# Patient Record
Sex: Male | Born: 1980 | Race: Black or African American | Hispanic: No | Smoking: Never smoker
Health system: Southern US, Community
[De-identification: ages and names within clinical notes are randomized; demographics above are authoritative.]

## PROBLEM LIST (undated history)

## (undated) DIAGNOSIS — E669 Obesity, unspecified: Secondary | ICD-10-CM

---

## 2018-05-08 ENCOUNTER — Emergency Department (HOSPITAL_COMMUNITY): Payer: Self-pay

## 2018-05-08 ENCOUNTER — Other Ambulatory Visit: Payer: Self-pay

## 2018-05-08 ENCOUNTER — Emergency Department (HOSPITAL_COMMUNITY)
Admission: EM | Admit: 2018-05-08 | Discharge: 2018-05-08 | Disposition: A | Discharge status: Home / Self Care | Payer: Self-pay | Attending: Physician Assistant | Admitting: Physician Assistant

## 2018-05-08 DIAGNOSIS — M25572 Pain in left ankle and joints of left foot: Secondary | ICD-10-CM | POA: Insufficient documentation

## 2018-05-08 DIAGNOSIS — R030 Elevated blood-pressure reading, without diagnosis of hypertension: Secondary | ICD-10-CM | POA: Insufficient documentation

## 2018-05-08 MED ORDER — MELOXICAM 15 MG PO TABS: 15.0000 mg | ORAL_TABLET | Freq: Every day | ORAL | 0 refills | Status: AC

## 2018-05-08 NOTE — ED Provider Notes (Signed)
Patient placed in Quick Look pathway, seen and evaluated   Chief Complaint: left ankle pain  HPI:   Jeff Walton is a 37 y.o. male who presents to the ED with left ankle pain that started a few days ago. Patient reports he has been walking a lot and thinks he may have injured it.   ROS: M/S: left ankle pain  Physical Exam:  BP (!) 160/100 (BP Location: Right Arm)   Pulse 82   Temp 98.2 F (36.8 C) (Oral)   Resp 18   SpO2 100%    Gen: No distress, morbidly obest  Neuro: Awake and Alert  Skin: Warm and dry  M/S: tender with palpation and range of motion left medial ankle.      Initiation of care has begun. The patient has been counseled on the process, plan, and necessity for staying for the completion/evaluation, and the remainder of the medical screening examination    Ashley Murrain, NP 05/08/18 1408    Julianne Rice, MD 05/09/18 [phone]

## 2018-05-08 NOTE — Discharge Instructions (Addendum)
Appear to have a tendinitis of the medial ankle from pronation.  You need to ice the ankle several times a day.  Take anti-inflammatories that I prescribed but do not take any over-the-counter Advil, Aleve, aspirin.  He may take Tylenol.  Please follow-up closely with Dr. Amalia Hailey or another podiatrist in his practice.  Return for any new or worsening symptoms including fevers or chills.

## 2018-05-08 NOTE — ED Triage Notes (Signed)
Pt. Works for [employer] and last week developed, lt. Ankle pain.  Pt. Denies any injuries,  Lt. Inner ankle is tender to touch and swollen.   Pt. Woke up this am and could not put weight on the ankle and fell getting out of bed.  Pt. Has been taking ibuprofen for pain.  +CNS noted

## 2018-05-08 NOTE — ED Provider Notes (Signed)
[name] EMERGENCY DEPARTMENT Provider Note   CSN: [id number] Arrival date & time: 05/08/18  1257     History   Chief Complaint Chief Complaint  Patient presents with  . Ankle Pain    HPI Jeff Walton is a 37 y.o. male with history of morbid obesity who presents the emergency department with chief complaint of pain in the medial left ankle.  Patient has a history of flatfeet.  Patient states that he started noticing some pain on the medial side of his foot which was worse with walking and better at rest.  He states that over the past 24 hours it has gotten far more painful.  Today he says he was barely able to walk on his foot.  He denies any injuries.  He denies fever or chills.  HPI  No past medical history on file.  There are no active problems to display for this patient.    The histories are not reviewed yet. Please review them in the "History" navigator section and refresh this [place].      Home Medications    Prior to Admission medications   Not on File    Family History No family history on file.  Social History Social History   Tobacco Use  . Smoking status: Not on file  Substance Use Topics  . Alcohol use: Not on file  . Drug use: Not on file     Allergies   Shellfish allergy   Review of Systems Review of Systems  Positive for gait abnormality, ankle swelling, negative for weakness or numbness Physical Exam Updated Vital Signs BP (!) 160/100 (BP Location: Right Arm)   Pulse 82   Temp 98.2 F (36.8 C) (Oral)   Resp 18   Ht 5\' 9"  (1.753 m)   Wt (!) 204.1 kg (450 lb)   SpO2 100%   BMI 66.45 kg/m   Physical Exam Physical Exam  Nursing note and vitals reviewed. Constitutional: He appears well-developed and well-nourished. No distress.  HENT:  Head: Normocephalic and atraumatic.  Eyes: Conjunctivae normal are normal. No scleral icterus.  Neck: Normal range of motion. Neck supple.  Cardiovascular: Normal rate,  regular rhythm and normal heart sounds.   Pulmonary/Chest: Effort normal and breath sounds normal. No respiratory distress.  Abdominal: Soft. There is no tenderness.  Musculoskeletal: Warmth heat and redness along the medial side of the left foot just proximal to the ankle in the distribution of the Peroneus tendon. Neurological: He is alert.  Skin: Skin is warm and dry. He is not diaphoretic.  Psychiatric: His behavior is normal.     ED Treatments / Results  Labs (all labs ordered are listed, but only abnormal results are displayed) Labs Reviewed - No data to display  EKG None  Radiology Dg Ankle Complete Left  Result Date: 05/08/2018 CLINICAL DATA:  Worsening left ankle pain when flexing the left foot. No injury. EXAM: LEFT ANKLE COMPLETE - 3+ VIEW COMPARISON:  None. FINDINGS: No acute fracture or dislocation is identified. There is mild lateral tibiotalar scratched of there is mild degenerative tibiotalar spurring. Mild dorsal spurring is noted in the midfoot. An os trigonum is noted. No focal soft tissue abnormality is seen. IMPRESSION: Mild degenerative changes without acute osseous abnormality. Electronically Signed   By: Logan Bores M.D.   On: 05/08/2018 14:56    Procedures Procedures (including critical care time)  Medications Ordered in ED Medications - No data to display   Initial Impression / Assessment  and Plan / ED Course  I have reviewed the triage vital signs and the nursing notes.  Pertinent labs & imaging results that were available during my care of the patient were reviewed by me and considered in my medical decision making (see chart for details).     Patient with medial ankle pain.  Suspect Perrone is tendinitis from ankle pronation flatfootedness, and morbid obesity.  Patient advised to ice and use anti-inflammatories.  I given an follow-up visit with Dr. Amalia Hailey in podiatry.  Patient appears appropriate for discharge at this time  Final Clinical  Impressions(s) / ED Diagnoses   Final diagnoses:  Acute left ankle pain  Elevated blood pressure reading    ED Discharge Orders    None       Margarita Mail, PA-C 05/08/18 1551    Macarthur Critchley, MD 05/09/18 [phone]

## 2018-05-12 ENCOUNTER — Ambulatory Visit: Payer: Self-pay | Admitting: Podiatry

## 2018-06-16 ENCOUNTER — Other Ambulatory Visit: Payer: Self-pay

## 2018-06-16 ENCOUNTER — Emergency Department (HOSPITAL_COMMUNITY): Payer: No Typology Code available for payment source

## 2018-06-16 ENCOUNTER — Emergency Department (HOSPITAL_COMMUNITY)
Admission: EM | Admit: 2018-06-16 | Discharge: 2018-06-16 | Disposition: A | Discharge status: Home / Self Care | Payer: No Typology Code available for payment source | Attending: Emergency Medicine | Admitting: Emergency Medicine

## 2018-06-16 DIAGNOSIS — M545 Low back pain, unspecified: Secondary | ICD-10-CM

## 2018-06-16 DIAGNOSIS — Y9389 Activity, other specified: Secondary | ICD-10-CM | POA: Diagnosis not present

## 2018-06-16 DIAGNOSIS — Y92410 Unspecified street and highway as the place of occurrence of the external cause: Secondary | ICD-10-CM | POA: Insufficient documentation

## 2018-06-16 DIAGNOSIS — M5489 Other dorsalgia: Secondary | ICD-10-CM | POA: Diagnosis present

## 2018-06-16 DIAGNOSIS — Y999 Unspecified external cause status: Secondary | ICD-10-CM | POA: Insufficient documentation

## 2018-06-16 DIAGNOSIS — G8911 Acute pain due to trauma: Secondary | ICD-10-CM | POA: Insufficient documentation

## 2018-06-16 MED ORDER — LIDOCAINE 5 % EX PTCH: 1.0000 | MEDICATED_PATCH | CUTANEOUS | 0 refills | Status: AC

## 2018-06-16 MED ORDER — CYCLOBENZAPRINE HCL 10 MG PO TABS: 10.0000 mg | ORAL_TABLET | Freq: Two times a day (BID) | ORAL | 0 refills | Status: DC | PRN
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
## 2018-08-07 ENCOUNTER — Emergency Department (HOSPITAL_COMMUNITY)
Admission: EM | Admit: 2018-08-07 | Discharge: 2018-08-08 | Disposition: A | Discharge status: Home / Self Care | Payer: No Typology Code available for payment source | Attending: Emergency Medicine | Admitting: Emergency Medicine

## 2018-08-07 ENCOUNTER — Other Ambulatory Visit: Payer: Self-pay

## 2018-08-07 ENCOUNTER — Encounter (HOSPITAL_COMMUNITY): Payer: Self-pay | Admitting: Emergency Medicine

## 2018-08-07 DIAGNOSIS — M545 Low back pain: Secondary | ICD-10-CM | POA: Insufficient documentation

## 2018-08-07 DIAGNOSIS — Y939 Activity, unspecified: Secondary | ICD-10-CM | POA: Insufficient documentation

## 2018-08-07 DIAGNOSIS — Y999 Unspecified external cause status: Secondary | ICD-10-CM | POA: Insufficient documentation

## 2018-08-07 DIAGNOSIS — Y92410 Unspecified street and highway as the place of occurrence of the external cause: Secondary | ICD-10-CM | POA: Diagnosis not present

## 2018-08-07 DIAGNOSIS — S39012A Strain of muscle, fascia and tendon of lower back, initial encounter: Secondary | ICD-10-CM

## 2018-08-07 DIAGNOSIS — S3992XA Unspecified injury of lower back, initial encounter: Secondary | ICD-10-CM | POA: Diagnosis present

## 2018-08-07 HISTORY — DX: Obesity, unspecified: E66.9
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
## 2018-08-07 NOTE — ED Notes (Signed)
Called for Pt to recheck vitals. No answer 

## 2018-08-08 ENCOUNTER — Emergency Department (HOSPITAL_COMMUNITY): Payer: No Typology Code available for payment source

## 2018-08-08 MED ORDER — PREDNISONE 50 MG PO TABS: 50.0000 mg | ORAL_TABLET | Freq: Every day | ORAL | 0 refills | Status: AC

## 2018-08-08 MED ORDER — CYCLOBENZAPRINE HCL 10 MG PO TABS: 10.0000 mg | ORAL_TABLET | Freq: Every day | ORAL | 0 refills | Status: AC

## 2018-08-08 MED ORDER — TRAMADOL HCL 50 MG PO TABS: 50.0000 mg | ORAL_TABLET | Freq: Four times a day (QID) | ORAL | 0 refills | Status: DC | PRN

## 2018-08-08 NOTE — Discharge Instructions (Signed)
Your x-rays did not show any abnormalities at this time.  Follow-up with the orthopedic office provided.  Return here for any worsening in your condition.  Use ice and heat on your lower back.
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
## 2020-01-14 ENCOUNTER — Emergency Department (HOSPITAL_COMMUNITY): Payer: No Typology Code available for payment source

## 2020-01-14 ENCOUNTER — Emergency Department (HOSPITAL_COMMUNITY)
Admission: EM | Admit: 2020-01-14 | Discharge: 2020-01-14 | Disposition: A | Discharge status: Home / Self Care | Payer: No Typology Code available for payment source | Attending: Emergency Medicine | Admitting: Emergency Medicine

## 2020-01-14 ENCOUNTER — Other Ambulatory Visit: Payer: Self-pay

## 2020-01-14 DIAGNOSIS — Y92410 Unspecified street and highway as the place of occurrence of the external cause: Secondary | ICD-10-CM | POA: Diagnosis not present

## 2020-01-14 DIAGNOSIS — Y939 Activity, unspecified: Secondary | ICD-10-CM | POA: Insufficient documentation

## 2020-01-14 DIAGNOSIS — Y999 Unspecified external cause status: Secondary | ICD-10-CM | POA: Diagnosis not present

## 2020-01-14 DIAGNOSIS — S199XXA Unspecified injury of neck, initial encounter: Secondary | ICD-10-CM | POA: Diagnosis present

## 2020-01-14 DIAGNOSIS — S161XXA Strain of muscle, fascia and tendon at neck level, initial encounter: Secondary | ICD-10-CM | POA: Diagnosis not present

## 2020-01-14 MED ORDER — IBUPROFEN 800 MG PO TABS: 800.0000 mg | ORAL_TABLET | Freq: Three times a day (TID) | ORAL | 0 refills | Status: AC | PRN

## 2020-01-14 MED ORDER — TRAMADOL HCL 50 MG PO TABS: 50.0000 mg | ORAL_TABLET | Freq: Four times a day (QID) | ORAL | 0 refills | Status: AC | PRN
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
## 2020-01-14 NOTE — Discharge Instructions (Addendum)
Return here as needed.  Follow-up with a primary doctor.  Use ice and heat on the areas that are sore.  You expect to be more sore later and over the next 7 to 10 days.
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
## 2020-01-14 NOTE — ED Notes (Signed)
Patient transported to CT 

## 2020-01-14 NOTE — ED Notes (Signed)
Discharge paperwork reviewed with pt.  Pt verbalized understanding, has no questions or concerns at this time.  Ambulatory at discharge.

## 2020-04-04 ENCOUNTER — Other Ambulatory Visit: Payer: Self-pay

## 2020-04-04 ENCOUNTER — Encounter (HOSPITAL_COMMUNITY): Payer: Self-pay

## 2020-04-04 ENCOUNTER — Ambulatory Visit (HOSPITAL_COMMUNITY)
Admission: EM | Admit: 2020-04-04 | Discharge: 2020-04-04 | Disposition: A | Discharge status: Home / Self Care | Payer: Commercial Managed Care - PPO

## 2020-04-04 DIAGNOSIS — D234 Other benign neoplasm of skin of scalp and neck: Secondary | ICD-10-CM

## 2020-04-04 NOTE — ED Provider Notes (Signed)
[facility]    CSN: [id number] Arrival date & time: 04/04/20  1807      History   Chief Complaint Chief Complaint  Patient presents with  . Mass    HPI Jeff Walton is a 39 y.o. male.   Patient reports for evaluation of a mass that has been present on the left side of his scalp for about 6 months.  Reports he noticed that after getting his haircut shoulder about 6 months ago.  He denies that it is grown since that time.  Denies pain, denies drainage.  He reports he is mainly considering removal for cosmetic reasons.     Past Medical History:  Diagnosis Date  . Obesity     There are no problems to display for this patient.   History reviewed. No pertinent surgical history.     Home Medications    Prior to Admission medications   Medication Sig Start Date End Date Taking? Authorizing Provider  cyclobenzaprine (FLEXERIL) 10 MG tablet Take 1 tablet (10 mg total) by mouth at bedtime. Patient not taking: Reported on 01/14/2020 08/08/18   Dalia Heading, PA-C  ibuprofen (ADVIL) 800 MG tablet Take 1 tablet (800 mg total) by mouth every 8 (eight) hours as needed. 01/14/20   Lawyer, Harrell Gave, PA-C  lidocaine (LIDODERM) 5 % Place 1 patch onto the skin daily. Remove & Discard patch within 12 hours or as directed by MD Patient not taking: Reported on 01/14/2020 06/16/18   Doristine Devoid, PA-C  meloxicam (MOBIC) 15 MG tablet Take 1 tablet (15 mg total) by mouth daily. Take 1 daily with food. Patient not taking: Reported on 01/14/2020 05/08/18   Margarita Mail, PA-C  predniSONE (DELTASONE) 50 MG tablet Take 1 tablet (50 mg total) by mouth daily with breakfast. Patient not taking: Reported on 01/14/2020 08/08/18   Dalia Heading, PA-C  traMADol (ULTRAM) 50 MG tablet Take 1 tablet (50 mg total) by mouth every 6 (six) hours as needed for severe pain. 01/14/20   Dalia Heading, PA-C    Family History Family History  Problem Relation Age of Onset  . Healthy  Mother   . Healthy Father     Social History Social History   Tobacco Use  . Smoking status: Never Smoker  . Smokeless tobacco: Never Used  Substance Use Topics  . Alcohol use: Never  . Drug use: Never     Allergies   Shellfish allergy   Review of Systems Review of Systems   Physical Exam Triage Vital Signs ED Triage Vitals  Enc Vitals Group     BP 04/04/20 1903 140/83     Pulse Rate 04/04/20 1903 88     Resp 04/04/20 1903 20     Temp 04/04/20 1903 99 F (37.2 C)     Temp Source 04/04/20 1903 Oral     SpO2 04/04/20 1903 98 %     Weight --      Height --      Head Circumference --      Peak Flow --      Pain Score 04/04/20 1902 0     Pain Loc --      Pain Edu? --      Excl. in [place]? --    No data found.  Updated Vital Signs BP 140/83 (BP Location: Right Wrist)   Pulse 88   Temp 99 F (37.2 C) (Oral)   Resp 20   SpO2 98%   Visual Acuity Right Eye Distance:  Left Eye Distance:   Bilateral Distance:    Right Eye Near:   Left Eye Near:    Bilateral Near:     Physical Exam Vitals and nursing note reviewed.  Constitutional:      Appearance: Normal appearance.  HENT:     Head:      Comments: There is a approximately 3.5 cm in diameter mobile cyst on the left sided scalp.  Roughly at the juncture of the occipital and parietal region.  This is marked accordingly in the chart.  No tenderness or erythema. Neurological:     Mental Status: He is alert.      UC Treatments / Results  Labs (all labs ordered are listed, but only abnormal results are displayed) Labs Reviewed - No data to display  EKG   Radiology No results found.  Procedures Procedures (including critical care time)  Medications Ordered in UC Medications - No data to display  Initial Impression / Assessment and Plan / UC Course  I have reviewed the triage vital signs and the nursing notes.  Pertinent labs & imaging results that were available during my care of the patient  were reviewed by me and considered in my medical decision making (see chart for details).     #Cyst of scalp Patient 39 year old with suspected cyst on left side of scalp.  Discussed with patient given the size location and his desire for best cosmetic and long-term outcome that would be best for him to have this handled with a surgery group.  [facility] surgery service information was given.  Discussed with patient of prior to being seen by surgical consultation, the cyst becomes very painful, red or starts draining anything to return for reevaluation for temporizing treatment.  He verbalized agreement understanding the plan. Final Clinical Impressions(s) / UC Diagnoses   Final diagnoses:  Dermoid cyst of scalp     Discharge Instructions     You have  cyst on your scalp and this will be best handled by a surgeon. Call central [facility] surgical service for evaluation of removal  If it becomes red, painful and drains, return to urgent care for evaluation    ED Prescriptions    None     PDMP not reviewed this encounter.   Purnell Shoemaker, PA-C 04/05/20 [phone]

## 2020-04-04 NOTE — ED Triage Notes (Signed)
Pt c/o "bump" to left side of head behind left ear for approx 6 months. Pt denies pain, drainage of area.  Area of approx 4cm diameter palpated with soft fluctuance noted.  Denies fever, n/v/d,.

## 2020-04-04 NOTE — Discharge Instructions (Signed)
You have  cyst on your scalp and this will be best handled by a surgeon. Call central [facility] surgical service for evaluation of removal  If it becomes red, painful and drains, return to urgent care for evaluation

## 2020-04-16 ENCOUNTER — Encounter (HOSPITAL_COMMUNITY): Payer: Self-pay | Admitting: Surgery

## 2020-04-16 ENCOUNTER — Ambulatory Visit (HOSPITAL_COMMUNITY): Payer: Self-pay | Admitting: Surgery

## 2020-04-16 DIAGNOSIS — R220 Localized swelling, mass and lump, head: Secondary | ICD-10-CM | POA: Insufficient documentation

## 2020-04-16 DIAGNOSIS — Z9989 Dependence on other enabling machines and devices: Secondary | ICD-10-CM | POA: Insufficient documentation

## 2020-04-16 NOTE — H&P (Signed)
Jeff Walton Appointment: 04/16/2020 1:45 PM Location: [facility] Patient #: [id number] DOB: 06-11-1981 Single / Language: Jeff Walton / Race: Black or African American Male  History of Present Illness Adin Hector MD; 04/16/2020 2:54 PM) The patient is a 39 year old male who presents with a soft tissue mass. Note for "Soft tissue mass": ` ` ` Patient sent for surgical consultation at the request of Roland Rack, PA, [place]  Chief Complaint: Scalp mass ` ` The patient is a pleasant morbidly obese male otherwise healthy and active. He noticed a lump in the back of his head. Has been there for at least 6 months. Feels like it's Slightly Larger. No Evidence of Infection or Inflammation. Discuss with Urgent Care Center. Concern for Cyst or Abscess. Surgical Consultation Offered Given Its Size and Location. Patient Denies Any History of Any Infections or Abscesses. 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 Recalls Bumping His Head but Not at That Particular Spot. No Headaches. No Lumps or Bumps Elsewhere. He Does Not Smoke. He Is Not Diabetic. He Does Have Sleep Apnea. He Uses CPAP at Night. Issues. Can Walk At Least a Half Hour without Difficulty  (Review of systems as stated in this history (HPI) or in the review of systems. Otherwise all other 12 point ROS are negative) ` ` `  This patient encounter took 25 minutes today to perform the following: obtain history, perform exam, review outside records, interpret tests & imaging, counsel the patient on their diagnosis; and, document this encounter, including findings & plan in the electronic health record (EHR).   Past Surgical History Sharyn Lull R. Brooks, CMA; 04/16/2020 1:39 PM) No pertinent past surgical history  Diagnostic Studies History Sharyn Lull R. Rolena Infante, CMA; 04/16/2020 1:39 PM) Colonoscopy never  Allergies Sharyn Lull R. Brooks, CMA; 04/16/2020 1:40 PM) Shellfish  Medication History (Michelle  R. Brooks, CMA; 04/16/2020 1:40 PM) No Current Medications Medications Reconciled  Social History Sharyn Lull R. Brooks, CMA; 04/16/2020 1:39 PM) No alcohol use No caffeine use No drug use Tobacco use Never smoker.  Family History Sharyn Lull R. Rolena Infante, CMA; 04/16/2020 1:39 PM) First Degree Relatives No pertinent family history  Other Problems Sharyn Lull R. Brooks, CMA; 04/16/2020 1:39 PM) Sleep Apnea     Review of Systems ([place]. Brooks CMA; 04/16/2020 1:39 PM) General Not Present- Appetite Loss, Chills, Fatigue, Fever, Night Sweats, Weight Gain and Weight Loss. Skin Not Present- Change in Wart/Mole, Dryness, Hives, Jaundice, New Lesions, Non-Healing Wounds, Rash and Ulcer. HEENT Not Present- Earache, Hearing Loss, Hoarseness, Nose Bleed, Oral Ulcers, Ringing in the Ears, Seasonal Allergies, Sinus Pain, Sore Throat, Visual Disturbances, Wears glasses/contact lenses and Yellow Eyes. Breast Not Present- Breast Mass, Breast Pain, Nipple Discharge and Skin Changes. Cardiovascular Not Present- Chest Pain, Difficulty Breathing Lying Down, Leg Cramps, Palpitations, Rapid Heart Rate, Shortness of Breath and Swelling of Extremities. Gastrointestinal Not Present- Abdominal Pain, Bloating, Bloody Stool, Change in Bowel Habits, Chronic diarrhea, Constipation, Difficulty Swallowing, Excessive gas, Gets full quickly at meals, Hemorrhoids, Indigestion, Nausea, Rectal Pain and Vomiting. Male Genitourinary Not Present- Blood in Urine, Change in Urinary Stream, Frequency, Impotence, Nocturia, Painful Urination, Urgency and Urine Leakage.  Vitals [employer] R. Brooks CMA; 04/16/2020 1:38 PM) 04/16/2020 1:37 PM Weight: 501.25 lb Height: 69in Body Surface Area: 3.05 m Body Mass Index: 74.02 kg/m  Pulse: 75 (Regular)  BP: 132/82(Sitting, Left Arm, Standard)        Physical Exam Adin Hector MD; 04/16/2020 2:10 PM)  General Mental Status-Alert. General  Appearance-Not in acute  distress, Not Sickly. Orientation-Oriented X3. Hydration-Well hydrated. Voice-Normal.  Integumentary Global Assessment Upon inspection and palpation of skin surfaces of the - Axillae: non-tender, no inflammation or ulceration, no drainage. and Distribution of scalp and body hair is normal. General Characteristics Temperature - normal warmth is noted.  Head and Neck Head-normocephalic, atraumatic with no lesions or palpable masses. Face Global Assessment - atraumatic, no absence of expression. Neck Global Assessment - no abnormal movements, no bruit auscultated on the right, no bruit auscultated on the left, no decreased range of motion, non-tender. Trachea-midline. Thyroid Gland Characteristics - non-tender. Note: On posterior scalp slightly left of benign is a 5x4 cm ellipsoid subcutaneous scalp mass. Somewhat mobile. No evident opening or sightings or inflammation.  Some lower scalp/upper neck nuchal fold but seems anomical. No other masses.  Eye Eyeball - Left-Extraocular movements intact, No Nystagmus - Left. Eyeball - Right-Extraocular movements intact, No Nystagmus - Right. Cornea - Left-No Hazy - Left. Cornea - Right-No Hazy - Right. Sclera/Conjunctiva - Left-No scleral icterus, No Discharge - Left. Sclera/Conjunctiva - Right-No scleral icterus, No Discharge - Right. Pupil - Left-Direct reaction to light normal. Pupil - Right-Direct reaction to light normal.  ENMT Ears Pinna - Left - no drainage observed, no generalized tenderness observed. Pinna - Right - no drainage observed, no generalized tenderness observed. Nose and Sinuses External Inspection of the Nose - no destructive lesion observed. Inspection of the nares - Left - quiet respiration. Inspection of the nares - Right - quiet respiration. Mouth and Throat Lips - Upper Lip - no fissures observed, no pallor noted. Lower Lip - no fissures observed, no pallor noted. Nasopharynx - no  discharge present. Oral Cavity/Oropharynx - Tongue - no dryness observed. Oral Mucosa - no cyanosis observed. Hypopharynx - no evidence of airway distress observed.  Chest and Lung Exam Inspection Movements - Normal and Symmetrical. Accessory muscles - No use of accessory muscles in breathing. Palpation Palpation of the chest reveals - Non-tender. Auscultation Breath sounds - Normal and Clear.  Cardiovascular Auscultation Rhythm - Regular. Murmurs & Other Heart Sounds - Auscultation of the heart reveals - No Murmurs and No Systolic Clicks.  Abdomen Inspection Inspection of the abdomen reveals - No Visible peristalsis and No Abnormal pulsations. Umbilicus - No Bleeding, No Urine drainage. Palpation/Percussion Palpation and Percussion of the abdomen reveal - Soft, Non Tender, No Rebound tenderness, No Rigidity (guarding) and No Cutaneous hyperesthesia. Note: Abdomen morbidly obese. Soft. No diastases. Not distended. No umbilical or incisional hernias. No guarding.  Male Genitourinary Sexual Maturity Tanner 5 - Adult hair pattern and Adult penile size and shape. Note: Hygiene good under panniculus. No inguinal lymphadenopathy or obvious hernias. Normal external male genitalia.  Peripheral Vascular Upper Extremity Inspection - Left - No Cyanotic nailbeds - Left, Not Ischemic. Inspection - Right - No Cyanotic nailbeds - Right, Not Ischemic.  Neurologic Neurologic evaluation reveals -normal attention span and ability to concentrate, able to name objects and repeat phrases. Appropriate fund of knowledge , normal sensation and normal coordination. Mental Status Affect - not angry, not paranoid. Cranial Nerves-Normal Bilaterally. Gait-Normal.  Neuropsychiatric Mental status exam performed with findings of-able to articulate well with normal speech/language, rate, volume and coherence, thought content normal with ability to perform basic computations and apply abstract  reasoning and no evidence of hallucinations, delusions, obsessions or homicidal/suicidal ideation.  Musculoskeletal Global Assessment Spine, Ribs and Pelvis - no instability, subluxation or laxity. Right Upper Extremity - no instability, subluxation or laxity.  Lymphatic Head & Neck  General Head & Neck Lymphatics: Bilateral - Description - No Localized lymphadenopathy. Axillary  General Axillary Region: Bilateral - Description - No Localized lymphadenopathy. Femoral & Inguinal  Generalized Femoral & Inguinal Lymphatics: Left - Description - No Localized lymphadenopathy. Right - Description - No Localized lymphadenopathy.    Assessment & Plan Adin Hector MD; 04/16/2020 2:55 PM)  SCALP MASS (R22.0) Impression: 5x4cm posterior scalp mass on the left side.  Ellipsoid and somewhat mobile. Still soft. Most likely benign epidermal cyst versus lipoma.  Because it has gotten slightly larger and occasionally irritating, it is reasonable to remove this. Given its size and scalp location, this is too much to try excise under local anesthetic. I would recommend outpatient surgery with monitored deep sedation if not Local Anesthetic. Should Be an Outpatient Surgery.  He Is Interested in Proceeding.  Current Plans You are being scheduled for surgery- Our schedulers will call you.  You should hear from our office's scheduling department within 5 working days about the location, date, and time of surgery. We try to make accommodations for patient's preferences in scheduling surgery, but sometimes the OR schedule or the surgeon's schedule prevents [place] from making those accommodations.  If you have not heard from our office [phone]) in 5 working days, call the office and ask for your surgeon's nurse.  If you have other questions about your diagnosis, plan, or surgery, call the office and ask for your surgeon's nurse.  The pathophysiology of skin & subcutaneous masses was discussed.  Natural history risks without surgery were discussed. I recommended surgery to remove the mass. I explained the technique of removal with use of local anesthesia & possible need for more aggressive sedation/anesthesia for patient comfort.  Risks such as bleeding, infection, wound breakdown, heart attack, death, and other risks were discussed. I noted a good likelihood this will help address the problem. Possibility that this will not correct all symptoms was explained. Possibility of regrowth/recurrence of the mass was discussed. We will work to minimize complications. Questions were answered. The patient expresses understanding & wishes to proceed with surgery.   OSA ON CPAP (G47.33) Impression: He does have sleep apnea but is compliant and controlled on his CPAP.   OBESITY, MORBID, BMI 50 OR HIGHER (E66.01) Impression: Super morbidly obese but otherwise active and functional.  He could benefit from weight loss program but will defer on this for now.  Current Plans Pt Education - [facility] Patient Info - Consider Bariatric surgery   Adin Hector, MD, FACS, MASCRS Gastrointestinal and Minimally Invasive Surgery  [facility] [address] [place], [place] [address] [phone] Fax [phone] Main/Paging  CONTACT INFORMATION: Weekday (9AM-5PM) concerns: Call [facility] main office at [phone] Weeknight (5PM-9AM) or Weekend/Holiday concerns: Check [email or web address] for General Surgery [facility] coverage (Please, do not use SecureChat as it is not reliable communication to operating surgeons for immediate patient care)

## 2020-05-06 ENCOUNTER — Other Ambulatory Visit (HOSPITAL_COMMUNITY): Payer: Commercial Managed Care - PPO
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
## 2022-07-10 ENCOUNTER — Encounter (HOSPITAL_COMMUNITY): Payer: Self-pay

## 2022-07-10 ENCOUNTER — Other Ambulatory Visit: Payer: Self-pay

## 2022-07-10 ENCOUNTER — Emergency Department (HOSPITAL_COMMUNITY)
Admission: EM | Admit: 2022-07-10 | Discharge: 2022-07-10 | Disposition: A | Discharge status: Home / Self Care | Payer: Commercial Managed Care - PPO | Attending: Emergency Medicine | Admitting: Emergency Medicine

## 2022-07-10 DIAGNOSIS — K047 Periapical abscess without sinus: Secondary | ICD-10-CM

## 2022-07-10 MED ORDER — AMOXICILLIN 500 MG PO CAPS: 500.0000 mg | ORAL_CAPSULE | Freq: Two times a day (BID) | ORAL | 0 refills | Status: AC

## 2022-07-10 NOTE — Discharge Instructions (Addendum)
Contact a health care provider if: Your pain is worse and is not helped by medicine. You have swelling. You see pus around the tooth. You have a fever or chills. Get help right away if: Your symptoms suddenly get worse. You have a very bad headache. You have problems breathing or swallowing. You have trouble opening your mouth. You have swelling in your neck or around your eye.

## 2022-07-10 NOTE — ED Provider Notes (Signed)
[facility] DEPT Provider Note   CSN: [id number] Arrival date & time: 07/10/22  0101     History  Chief Complaint  Patient presents with   Dental Pain    Jeff Walton is a 41 y.o. male who presents emergency department with a chief complaint of dental pain.  Patient states that he is left upper molar crown broke off about 2 weeks ago.  He started noticing some pain and swelling over the past couple days.  Last night he fell asleep and that he felt that there was pus leaking from the area.  He rates his pain at minimal but has significant swelling to his left cheek and came in for evaluation.   Dental Pain      Home Medications Prior to Admission medications   Medication Sig Start Date End Date Taking? Authorizing Provider  amoxicillin (AMOXIL) 500 MG capsule Take 1 capsule (500 mg total) by mouth 2 (two) times daily for 7 days. 07/10/22 07/17/22 Yes Saivion Goettel, PA-C  cyclobenzaprine (FLEXERIL) 10 MG tablet Take 1 tablet (10 mg total) by mouth at bedtime. Patient not taking: Reported on 01/14/2020 08/08/18   Dalia Heading, PA-C  ibuprofen (ADVIL) 800 MG tablet Take 1 tablet (800 mg total) by mouth every 8 (eight) hours as needed. 01/14/20   Lawyer, Harrell Gave, PA-C  lidocaine (LIDODERM) 5 % Place 1 patch onto the skin daily. Remove & Discard patch within 12 hours or as directed by MD Patient not taking: Reported on 01/14/2020 06/16/18   Doristine Devoid, PA-C  meloxicam (MOBIC) 15 MG tablet Take 1 tablet (15 mg total) by mouth daily. Take 1 daily with food. Patient not taking: Reported on 01/14/2020 05/08/18   Margarita Mail, PA-C  predniSONE (DELTASONE) 50 MG tablet Take 1 tablet (50 mg total) by mouth daily with breakfast. Patient not taking: Reported on 01/14/2020 08/08/18   Dalia Heading, PA-C  traMADol (ULTRAM) 50 MG tablet Take 1 tablet (50 mg total) by mouth every 6 (six) hours as needed for severe pain. 01/14/20   Lawyer, Harrell Gave, PA-C       Allergies    Shellfish allergy    Review of Systems   Review of Systems  Physical Exam Updated Vital Signs BP (!) 145/98 (BP Location: Left Arm)   Pulse (!) 107   Temp 98.9 F (37.2 C) (Oral)   Resp 18   Ht '5\' 8"'$  (1.727 m)   Wt (!) 217.7 kg   SpO2 96%   BMI 72.98 kg/m  Physical Exam Vitals and nursing note reviewed.  Constitutional:      General: He is not in acute distress.    Appearance: He is well-developed. He is not diaphoretic.  HENT:     Head: Normocephalic and atraumatic.     Mouth/Throat:     Comments: Poor dentition with multiple caries.  The left upper second molar is fractured down to the gumline with dental decay.  Obvious periapical abscess is open and draining purulent discharge  Eyes:     General: No scleral icterus.    Conjunctiva/sclera: Conjunctivae normal.  Cardiovascular:     Rate and Rhythm: Normal rate and regular rhythm.     Heart sounds: Normal heart sounds.  Pulmonary:     Effort: Pulmonary effort is normal. No respiratory distress.     Breath sounds: Normal breath sounds.  Abdominal:     Palpations: Abdomen is soft.     Tenderness: There is no abdominal tenderness.  Musculoskeletal:  Cervical back: Normal range of motion and neck supple.  Skin:    General: Skin is warm and dry.  Neurological:     Mental Status: He is alert.  Psychiatric:        Behavior: Behavior normal.     ED Results / Procedures / Treatments   Labs (all labs ordered are listed, but only abnormal results are displayed) Labs Reviewed - No data to display  EKG None  Radiology No results found.  Procedures Procedures    Medications Ordered in ED Medications - No data to display  ED Course/ Medical Decision Making/ A&P                           Medical Decision Making Patient with periapical abscess that is actively draining.  No significant pain, no signs of Ludwig's angina.  Will discharge with amoxicillin.  He follow-up with dentist.  Appears  appropriate for discharge at this time        Final Clinical Impression(s) / ED Diagnoses Final diagnoses:  Dental abscess    Rx / DC Orders ED Discharge Orders          Ordered    amoxicillin (AMOXIL) 500 MG capsule  2 times daily        07/10/22 0245              Margarita Mail, PA-C 07/10/22 0309    Orpah Greek, MD 07/10/22 [phone]

## 2022-07-10 NOTE — ED Triage Notes (Signed)
Left upper dental decay. Facial swelling.

## 2024-02-19 ENCOUNTER — Emergency Department (HOSPITAL_COMMUNITY)
Admission: EM | Admit: 2024-02-19 | Discharge: 2024-02-20 | Disposition: A | Discharge status: Home / Self Care | Attending: Emergency Medicine | Admitting: Emergency Medicine

## 2024-02-19 DIAGNOSIS — K1120 Sialoadenitis, unspecified: Secondary | ICD-10-CM | POA: Diagnosis not present

## 2024-02-19 DIAGNOSIS — R221 Localized swelling, mass and lump, neck: Secondary | ICD-10-CM | POA: Diagnosis present

## 2024-02-19 NOTE — ED Triage Notes (Addendum)
 Pt presents to ED from home c/o pain of the left side of the throat started 3 days ago.  Pt states it is harder to swallow and it feels itchy. Pt thinks it maybe because of his back tooth. Denies SOB

## 2024-02-20 ENCOUNTER — Other Ambulatory Visit: Payer: Self-pay

## 2024-02-20 ENCOUNTER — Emergency Department (HOSPITAL_COMMUNITY)

## 2024-02-20 ENCOUNTER — Encounter (HOSPITAL_COMMUNITY): Payer: Self-pay

## 2024-02-20 LAB — BASIC METABOLIC PANEL WITH GFR
Anion gap: 9 (ref 5–15)
BUN: 13 mg/dL (ref 6–20)
CO2: 26 mmol/L (ref 22–32)
Calcium: 8.8 mg/dL — ABNORMAL LOW (ref 8.9–10.3)
Chloride: 102 mmol/L (ref 98–111)
Creatinine, Ser: 0.93 mg/dL (ref 0.61–1.24)
GFR, Estimated: >60 mL/min (ref >60)
Glucose, Bld: 97 mg/dL (ref 70–99)
Potassium: 4 mmol/L (ref 3.5–5.1)
Sodium: 137 mmol/L (ref 135–145)

## 2024-02-20 LAB — CBC WITH DIFFERENTIAL/PLATELET
Abs Immature Granulocytes: 0.02 10*3/uL (ref 0.00–0.07)
Basophils Absolute: 0.1 10*3/uL (ref 0.0–0.1)
Basophils Relative: 1 %
Eosinophils Absolute: 0.3 10*3/uL (ref 0.0–0.5)
Eosinophils Relative: 3 %
HCT: 42.2 % (ref 39.0–52.0)
Hemoglobin: 13.4 g/dL (ref 13.0–17.0)
Immature Granulocytes: 0 %
Lymphocytes Relative: 24 %
Lymphs Abs: 2 10*3/uL (ref 0.7–4.0)
MCH: 30.9 pg (ref 26.0–34.0)
MCHC: 31.8 g/dL (ref 30.0–36.0)
MCV: 97.2 fL (ref 80.0–100.0)
Monocytes Absolute: 0.8 10*3/uL (ref 0.1–1.0)
Monocytes Relative: 10 %
Neutro Abs: 5.2 10*3/uL (ref 1.7–7.7)
Neutrophils Relative %: 62 %
Platelets: 261 10*3/uL (ref 150–400)
RBC: 4.34 MIL/uL (ref 4.22–5.81)
RDW: 14.2 % (ref 11.5–15.5)
WBC: 8.4 10*3/uL (ref 4.0–10.5)
nRBC: 0 % (ref 0.0–0.2)

## 2024-02-20 MED ORDER — CLINDAMYCIN PHOSPHATE 600 MG/50ML IV SOLN
600.0000 mg | Freq: Once | INTRAVENOUS | Status: AC
  Administered 2024-02-20: 600 mg via INTRAVENOUS
  Filled 2024-02-20: qty 50

## 2024-02-20 MED ORDER — SODIUM CHLORIDE (PF) 0.9 % IJ SOLN
INTRAMUSCULAR | Status: AC
  Filled 2024-02-20: qty 50

## 2024-02-20 MED ORDER — CLINDAMYCIN HCL 150 MG PO CAPS: 450.0000 mg | ORAL_CAPSULE | Freq: Three times a day (TID) | ORAL | 0 refills | Status: AC

## 2024-02-20 MED ORDER — IOHEXOL 300 MG/ML  SOLN
100.0000 mL | Freq: Once | INTRAMUSCULAR | Status: AC | PRN
  Administered 2024-02-20: 100 mL via INTRAVENOUS

## 2024-02-20 NOTE — ED Provider Notes (Signed)
 Jeff Walton   CSN: [id number] Arrival date & time: 02/19/24  2346     History  Chief Complaint  Patient presents with   Sore Throat    Left sided neck pain    Jeff Walton is a 43 y.o. male.  The history is provided by the patient.  Sore Throat  Jeff Walton is a 43 y.o. male who presents to the Emergency Department complaining of neck swelling.  He presents to the emergency department for evaluation of left sided neck swelling for the last 3 days.  He feels like he is talking and swallowing different secondary to this swelling.  The change in his speech and swallowing started today.  No fever, difficulty breathing.  He suspects it is coming from a dental infection although he does not have any dental pain.  He does use a pop on veneers.  He does not have a dentist.  He has no known medical problems and takes no routine medications.     Home Medications Prior to Admission medications   Medication Sig Start Date End Date Taking? Authorizing Provider  clindamycin (CLEOCIN) 150 MG capsule Take 3 capsules (450 mg total) by mouth 3 (three) times daily for 7 days. 02/20/24 02/27/24 Yes Tilden Fossa, MD  cyclobenzaprine (FLEXERIL) 10 MG tablet Take 1 tablet (10 mg total) by mouth at bedtime. Patient not taking: Reported on 01/14/2020 08/08/18   Charlestine Night, PA-C  ibuprofen (ADVIL) 800 MG tablet Take 1 tablet (800 mg total) by mouth every 8 (eight) hours as needed. 01/14/20   Lawyer, Cristal Deer, PA-C  lidocaine (LIDODERM) 5 % Place 1 patch onto the skin daily. Remove & Discard patch within 12 hours or as directed by MD Patient not taking: Reported on 01/14/2020 06/16/18   Rise Mu, PA-C  meloxicam (MOBIC) 15 MG tablet Take 1 tablet (15 mg total) by mouth daily. Take 1 daily with food. Patient not taking: Reported on 01/14/2020 05/08/18   Arthor Captain, PA-C  predniSONE (DELTASONE) 50 MG tablet Take 1 tablet (50 mg total)  by mouth daily with breakfast. Patient not taking: Reported on 01/14/2020 08/08/18   Charlestine Night, PA-C  traMADol (ULTRAM) 50 MG tablet Take 1 tablet (50 mg total) by mouth every 6 (six) hours as needed for severe pain. 01/14/20   Lawyer, Cristal Deer, PA-C      Allergies    Shellfish allergy    Review of Systems   Review of Systems  All other systems reviewed and are negative.   Physical Exam Updated Vital Signs BP 129/73   Pulse 71   Temp 98.2 F (36.8 C) (Oral)   Resp 18   Ht 5\' 8"  (1.727 m)   Wt (!) 195.5 kg   SpO2 96%   BMI 65.53 kg/m  Physical Exam Vitals and nursing Walton reviewed.  Constitutional:      Appearance: He is well-developed. He is obese.  HENT:     Head: Normocephalic and atraumatic.     Comments: There is fullness and mild tenderness to the left submandibular gland.  There is mild muffling to his voice.  There is no appreciable sublingual swelling.  There is mild erythema in the posterior oropharynx    Mouth/Throat:     Mouth: Mucous membranes are moist.  Cardiovascular:     Rate and Rhythm: Normal rate and regular rhythm.  Pulmonary:     Effort: Pulmonary effort is normal. No respiratory distress.  Musculoskeletal:  General: No tenderness.     Cervical back: Neck supple.  Skin:    General: Skin is warm and dry.  Neurological:     Mental Status: He is alert and oriented to person, place, and time.  Psychiatric:        Behavior: Behavior normal.     ED Results / Procedures / Treatments   Labs (all labs ordered are listed, but only abnormal results are displayed) Labs Reviewed  BASIC METABOLIC PANEL WITH GFR - Abnormal; Notable for the following components:      Result Value   Calcium 8.8 (*)    All other components within normal limits  CBC WITH DIFFERENTIAL/PLATELET    EKG None  Radiology CT Soft Tissue Neck W Contrast Result Date: 02/20/2024 CLINICAL DATA:  Left-sided throat pain EXAM: CT NECK WITH CONTRAST TECHNIQUE:  Multidetector CT imaging of the neck was performed using the standard protocol following the bolus administration of intravenous contrast. RADIATION DOSE REDUCTION: This exam was performed according to the departmental dose-optimization program which includes automated exposure control, adjustment of the mA and/or kV according to patient size and/or use of iterative reconstruction technique. CONTRAST:  OMNIPAQUE IOHEXOL 300 MG/ML  SOLN COMPARISON:  CT cervical spine 01/14/2019 FINDINGS: PHARYNX AND LARYNX: There is edema the left sublingual and submandibular space. Left palatine peritonsillar edema without fluid collection. No retropharyngeal abnormality. The larynx is normal. Normal epiglottis. SALIVARY GLANDS: There are large stones within the left submandibular gland measuring up to approximately 15 mm. There is inflammatory change within the left submandibular space without abscess or drainable fluid collection. The parotid glands are normal. THYROID: Normal. LYMPH NODES: Reactive left submandibular lymph nodes. VASCULAR: Major cervical vessels are patent. LIMITED INTRACRANIAL: Normal. VISUALIZED ORBITS: Normal. MASTOIDS AND VISUALIZED PARANASAL SINUSES: No fluid levels or advanced mucosal thickening. No mastoid effusion. SKELETON: No bony spinal canal stenosis. No lytic or blastic lesions. UPPER CHEST: Clear. OTHER: Multiple dental periapical lucencies. 2.8 cm left posterior scalp subcutaneous fluid collection, likely an inclusion cyst. IMPRESSION: 1. Left submandibular sialoadenitis with large stones measuring up to approximately 15 mm. Surrounding inflammatory change consistent with sialadenitis. No abscess or drainable fluid collection. 2. Left palatine peritonsillar edema without fluid collection. 3. Multiple dental periapical lucencies. Electronically Signed   By: Deatra Robinson M.D.   On: 02/20/2024 03:57    Procedures Procedures    Medications Ordered in ED Medications  clindamycin  (CLEOCIN) IVPB 600 mg (0 mg Intravenous Stopped 02/20/24 0124)  iohexol (OMNIPAQUE) 300 MG/ML solution 100 mL (100 mLs Intravenous Contrast Given 02/20/24 1610)    ED Course/ Medical Decision Making/ A&P                                 Medical Decision Making Amount and/or Complexity of Data Reviewed Labs: ordered. Radiology: ordered.  Risk Prescription drug management.   Patient here for evaluation of swelling to his left neck.  On examination he does have submandibular swelling, no clinical evidence of Ludwigs.  He does have mild alteration to his voice but no stridor.  He handles his secretions without difficulty.  Labs are unremarkable.  CT does demonstrate sialoadenitis.  There is also some mild peritonsillar edema on the left.  Will start on antibiotics.  Discussed with patient home care with sialagogues.  Will start on clindamycin.  Discussed importance of ENT follow-up given the size of his stones.  Discussed return precautions for progressive symptoms.  Final Clinical Impression(s) / ED Diagnoses Final diagnoses:  Sialoadenitis    Rx / DC Orders ED Discharge Orders          Ordered    clindamycin (CLEOCIN) 150 MG capsule  3 times daily        02/20/24 0413              Tilden Fossa, MD 02/20/24 [phone]
